# Patient Record
Sex: Female | Born: 1995 | Race: White | Hispanic: No | Marital: Single | Smoking: Never smoker
Health system: Southern US, Community
[De-identification: ages and names within clinical notes are randomized; demographics above are authoritative.]

## PROBLEM LIST (undated history)

## (undated) HISTORY — PX: TYMPANOPLASTY: SHX33

---

## 2017-06-27 ENCOUNTER — Ambulatory Visit
Admission: RE | Admit: 2017-06-27 | Discharge: 2017-06-27 | Disposition: A | Discharge status: Home / Self Care | Payer: BLUE CROSS/BLUE SHIELD | Source: Ambulatory Visit | Attending: Physician Assistant | Admitting: Physician Assistant

## 2017-06-27 ENCOUNTER — Other Ambulatory Visit: Payer: Self-pay | Admitting: Physician Assistant

## 2017-06-27 DIAGNOSIS — R05 Cough: Secondary | ICD-10-CM

## 2017-06-27 DIAGNOSIS — R059 Cough, unspecified: Secondary | ICD-10-CM

## 2018-03-21 IMAGING — DX DG CHEST 2V
2 series · 2 of 2 positions shown · non-contrast
Comparison: None.

CLINICAL DATA: Cough for 1 month

EXAM:
CHEST  2 VIEW

[dg chest 2 view (1 of 2)]
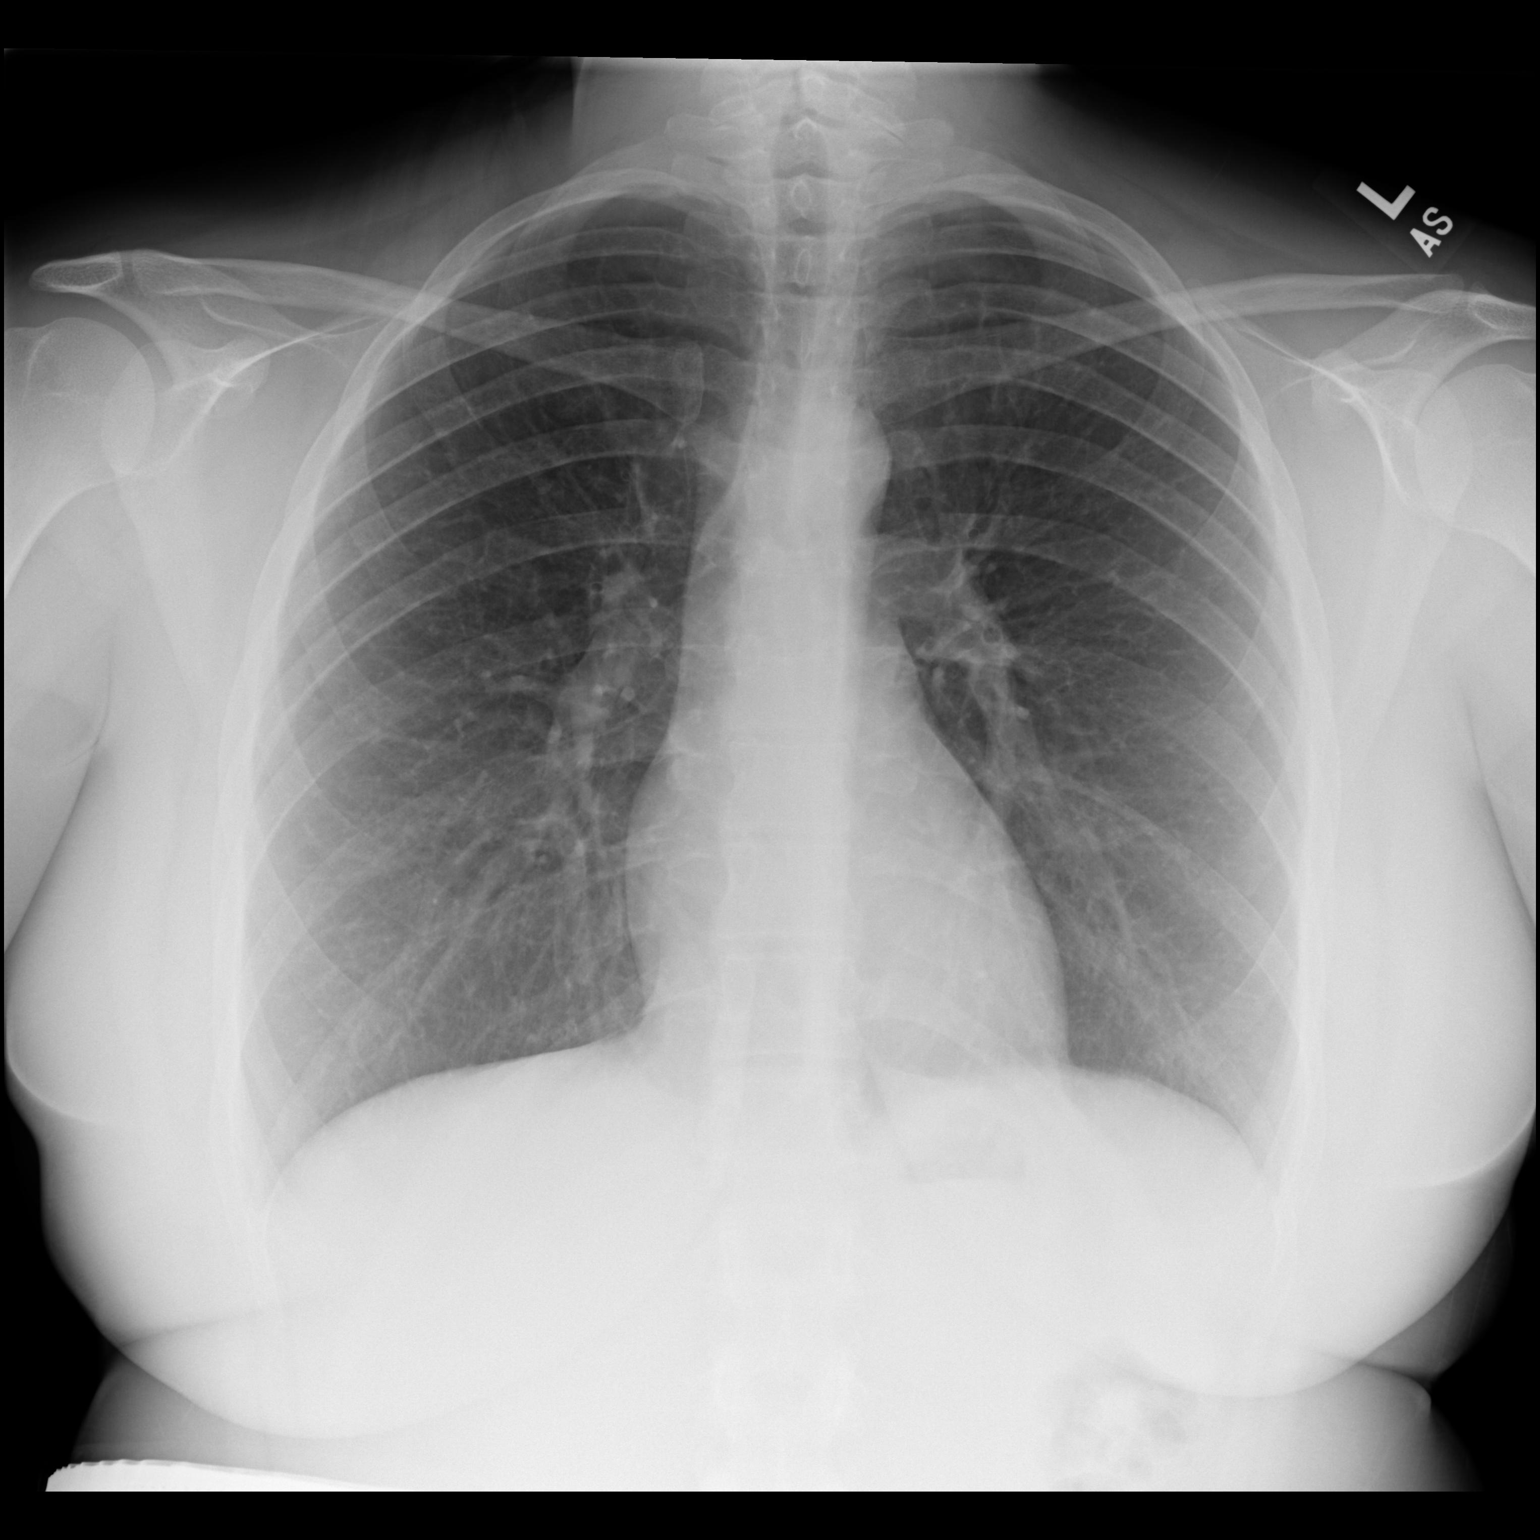

[dg chest 2 view (2 of 2)]
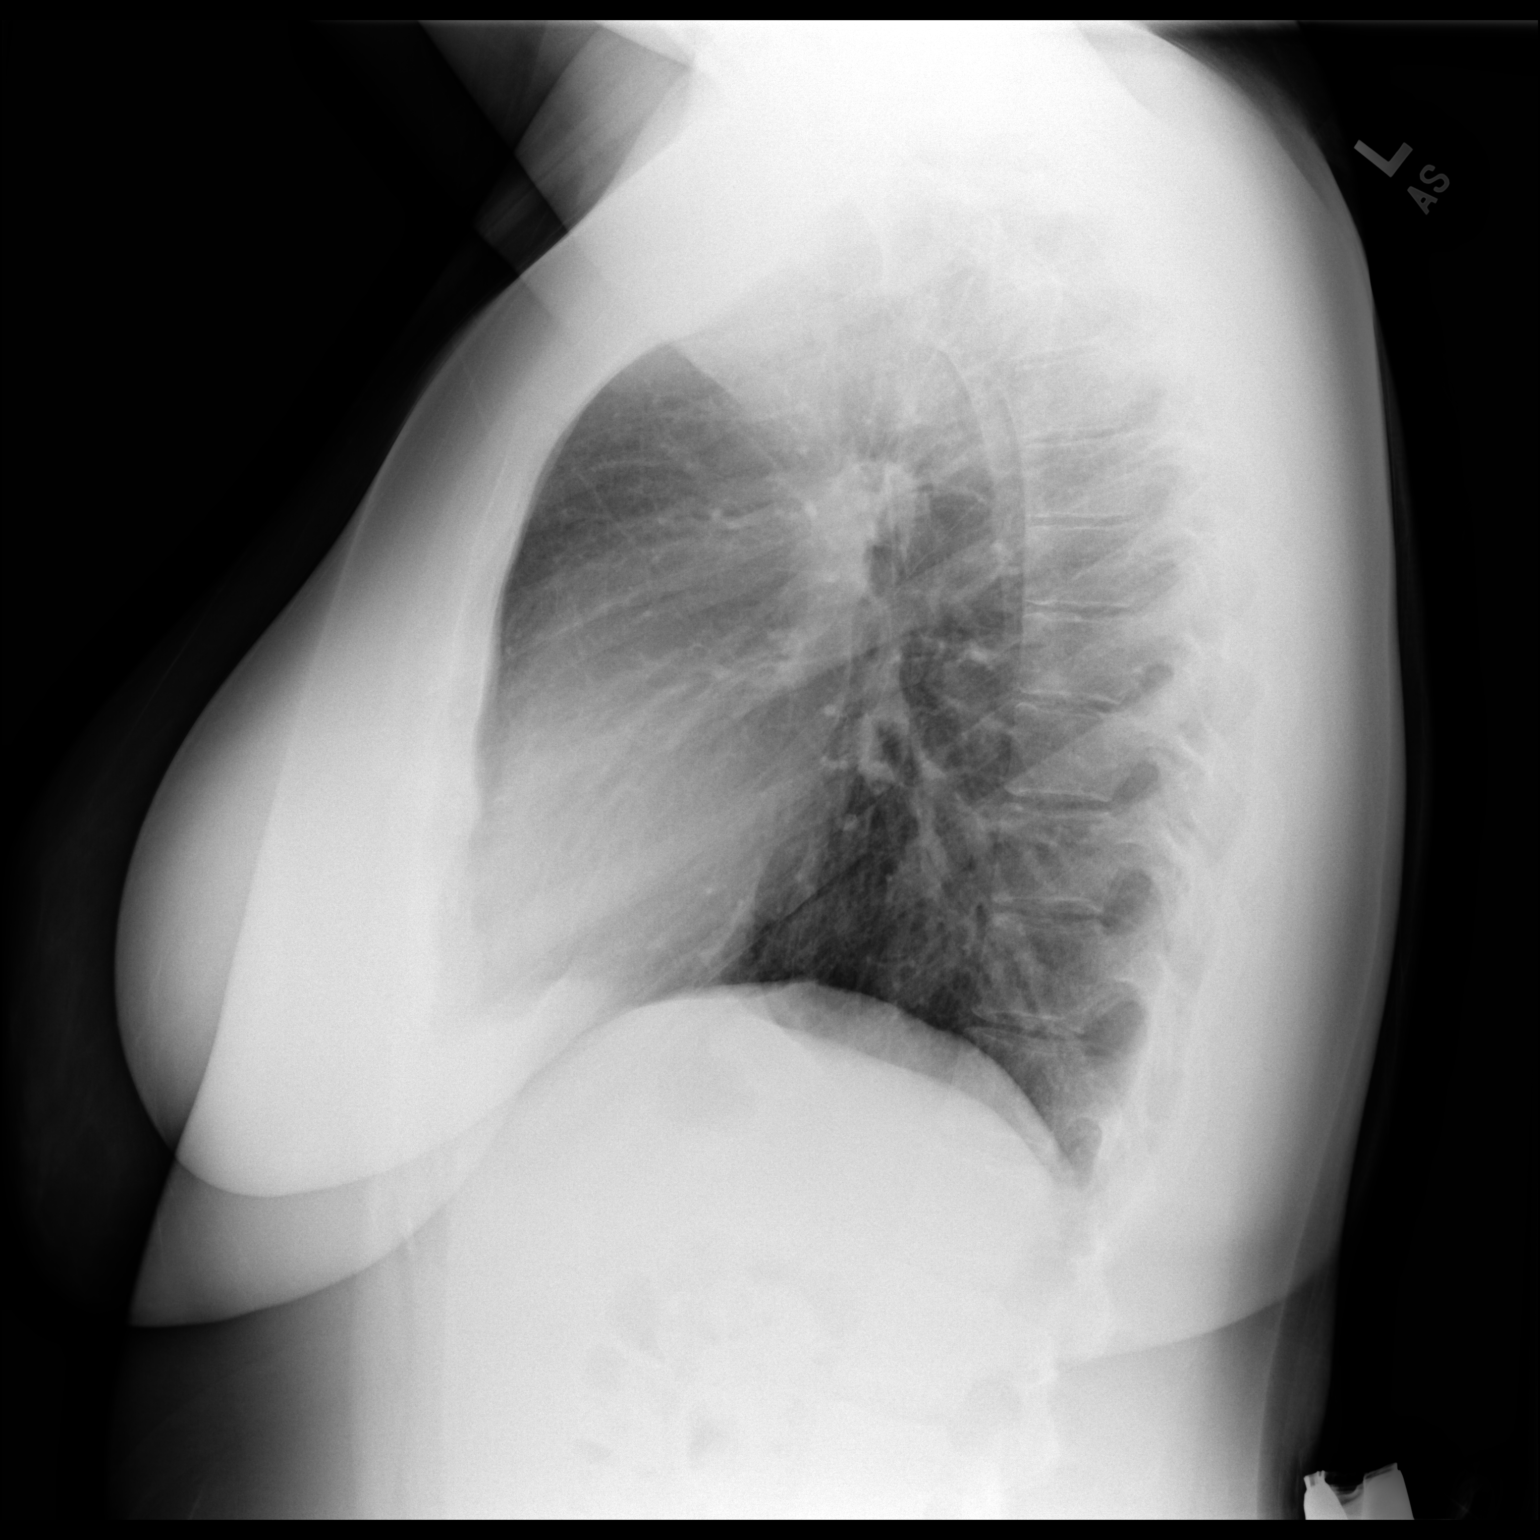

[2 of 2 positions shown; findings below may reference images not displayed]

FINDINGS: The heart size and mediastinal contours are within normal limits.
Both lungs are clear. The visualized skeletal structures are
unremarkable.
IMPRESSION: No active cardiopulmonary disease.

## 2019-06-18 ENCOUNTER — Other Ambulatory Visit: Payer: Self-pay

## 2019-06-18 DIAGNOSIS — Z20822 Contact with and (suspected) exposure to covid-19: Secondary | ICD-10-CM

## 2019-06-19 LAB — NOVEL CORONAVIRUS, NAA: SARS-CoV-2, NAA: NOT DETECTED

## 2019-08-14 ENCOUNTER — Other Ambulatory Visit: Payer: Self-pay

## 2019-08-14 ENCOUNTER — Encounter (HOSPITAL_BASED_OUTPATIENT_CLINIC_OR_DEPARTMENT_OTHER): Payer: Self-pay | Admitting: Emergency Medicine

## 2019-08-14 ENCOUNTER — Emergency Department (HOSPITAL_BASED_OUTPATIENT_CLINIC_OR_DEPARTMENT_OTHER)
Admission: EM | Admit: 2019-08-14 | Discharge: 2019-08-14 | Disposition: A | Discharge status: Home / Self Care | Payer: BLUE CROSS/BLUE SHIELD | Attending: Emergency Medicine | Admitting: Emergency Medicine

## 2019-08-14 ENCOUNTER — Emergency Department (HOSPITAL_BASED_OUTPATIENT_CLINIC_OR_DEPARTMENT_OTHER): Payer: BLUE CROSS/BLUE SHIELD

## 2019-08-14 DIAGNOSIS — M79605 Pain in left leg: Secondary | ICD-10-CM | POA: Diagnosis not present

## 2019-08-14 DIAGNOSIS — M79604 Pain in right leg: Secondary | ICD-10-CM | POA: Diagnosis present

## 2019-08-14 DIAGNOSIS — Z79899 Other long term (current) drug therapy: Secondary | ICD-10-CM | POA: Insufficient documentation

## 2019-08-14 LAB — D-DIMER, QUANTITATIVE: D-Dimer, Quant: 0.33 ug/mL-FEU (ref 0.00–0.50)

## 2019-08-14 NOTE — ED Triage Notes (Signed)
Pt c/o bilateral leg pain x 3 days. Pt reports new birth control x 16 days, denies injury, also denies sob, denies cough

## 2019-08-14 NOTE — Discharge Instructions (Signed)
Your [place] and blood indicate that you do NOT have a blood clot in your legs or lungs. You may use tylenol or motrin for pain. You may try wearing compression stockings. Your leg pain does not appear to be from a life, limb or organ threatening cause.  Please follow very closely with your primary care doctor.

## 2019-08-14 NOTE — ED Provider Notes (Signed)
[facility] EMERGENCY DEPARTMENT Provider Note   CSN: [id number] Arrival date & time: 08/14/19  1508     History Chief Complaint  Patient presents with  . Leg Pain    Bilateral    Jessica Riley is a 23 y.o. female who presents emergency department bilateral lower extremity pain.  Patient states that she started a new pack of oral contraceptive and had onset of bilateral lower extremity pain and heaviness of the calves about 30 minutes after taking her medication.  She states that when she was her symptoms had resolved.  Patient states that everything was going well up until Christmas Day when she had return of the symptoms which made her feel very anxious and feeling like intermittently short of breath which she attributes to anxiety.  She denies any hemoptysis.  She denies a history of DVT or PE personally or family history of DVT or PE.  She does not have any recent injuries, fevers, chills, weakness.  HPI     History reviewed. No pertinent past medical history.  There are no problems to display for this patient.   Past Surgical History:  Procedure Laterality Date  . TYMPANOPLASTY       OB History   No obstetric history on file.     History reviewed. No pertinent family history.  Social History   Tobacco Use  . Smoking status: Never Smoker  Substance Use Topics  . Alcohol use: Yes  . Drug use: Never    Home Medications Prior to Admission medications   Medication Sig Start Date End Date Taking? Authorizing Provider  buPROPion (WELLBUTRIN XL) 150 MG 24 hr tablet Take 150 mg by mouth daily.   Yes [provider]    Allergies    Patient has no allergy information on record.  Review of Systems   Review of Systems Ten systems reviewed and are negative for acute change, except as noted in the HPI.   Physical Exam Updated Vital Signs BP 110/71 (BP Location: Right Arm)   Pulse (!) 118   Temp 98.5 F (36.9 C) (Oral)   Resp 18   Wt 96.2 kg    LMP 07/28/2019 (Approximate)   SpO2 100%   Physical Exam Vitals and nursing note reviewed.  Constitutional:      General: She is not in acute distress.    Appearance: She is well-developed. She is not diaphoretic.  HENT:     Head: Normocephalic and atraumatic.  Eyes:     General: No scleral icterus.    Conjunctiva/sclera: Conjunctivae normal.  Cardiovascular:     Rate and Rhythm: Normal rate and regular rhythm.     Heart sounds: Normal heart sounds. No murmur. No friction rub. No gallop.   Pulmonary:     Effort: Pulmonary effort is normal. No respiratory distress.     Breath sounds: Normal breath sounds.  Abdominal:     General: Bowel sounds are normal. There is no distension.     Palpations: Abdomen is soft. There is no mass.     Tenderness: There is no abdominal tenderness. There is no guarding.  Musculoskeletal:     Cervical back: Normal range of motion.     Right lower leg: No edema.     Left lower leg: No edema.  Skin:    General: Skin is warm and dry.  Neurological:     Mental Status: She is alert and oriented to person, place, and time.  Psychiatric:  Behavior: Behavior normal.     ED Results / Procedures / Treatments   Labs (all labs ordered are listed, but only abnormal results are displayed) Labs Reviewed - No data to display  EKG None  Radiology No results found.  Procedures Procedures (including critical care time)  Medications Ordered in ED Medications - No data to display  ED Course  I have reviewed the triage vital signs and the nursing notes.  Pertinent labs & imaging results that were available during my care of the patient were reviewed by me and considered in my medical decision making (see chart for details).    MDM Rules/Calculators/A&P                      Here with complaint of bilateral leg pain.  No joint pain or swelling.  No fevers or chills suggestive of autoimmune process.  It has negative bilateral DVT studies on my  interpretation.  Patient did have initial tachycardia however she has a D-dimer within normal limits and has no symptoms of pulmonary embolus to include chest pain, pleuritic pain or shortness of breath.  I discussed case with Dr. Clarene Duke.  Patient will be discharged to follow-up with primary care physician.  Discussed return precautions.  She was appropriate for discharge at this time Final Clinical Impression(s) / ED Diagnoses Final diagnoses:  None    Rx / DC Orders ED Discharge Orders    None       Arthor Captain, PA-C 08/14/19 2355    Little, Ambrose [place], MD 08/15/19 1524

## 2020-05-07 IMAGING — US US EXTREM LOW VENOUS
1 series · 13 of 24 positions shown · non-contrast
Comparison: None

CLINICAL DATA: BILATERAL knee pain and heaviness 2 days after
starting in taking new birth control pills



[Series 1: us extrem low venous · 13 of 58 slices shown]
[im 1/58]
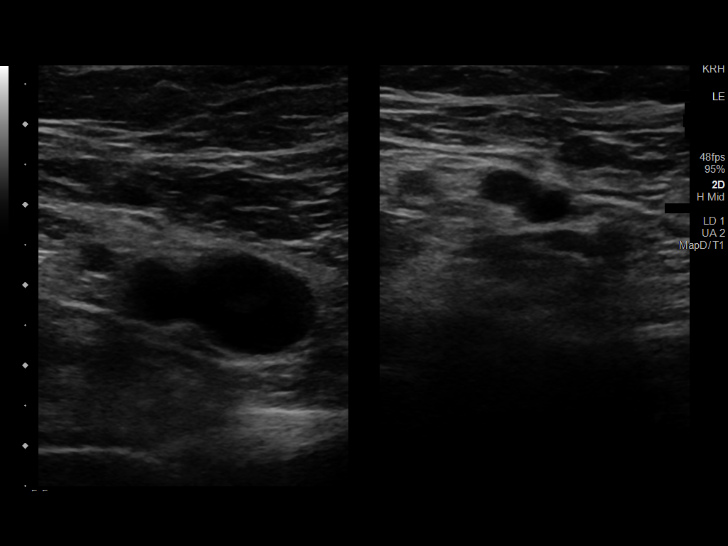
[im 5/58]
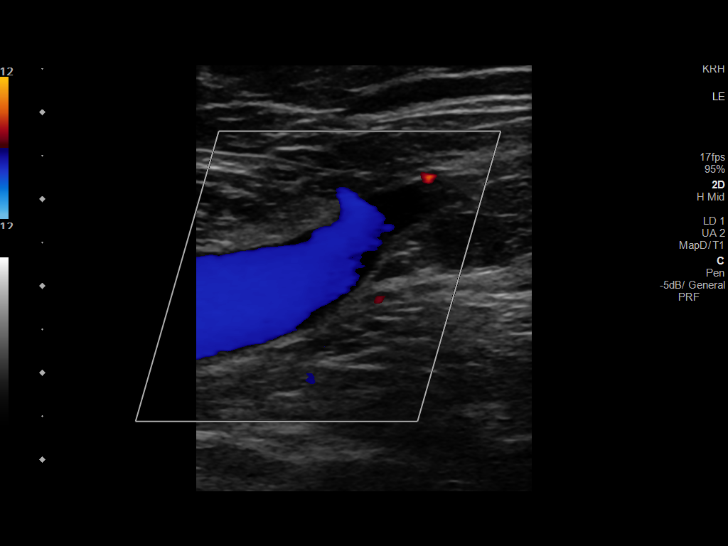
[im 10/58]
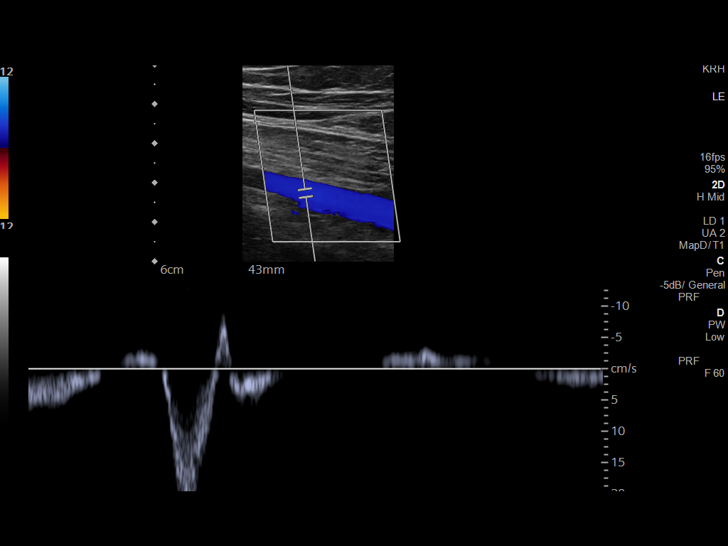
[im 15/58]
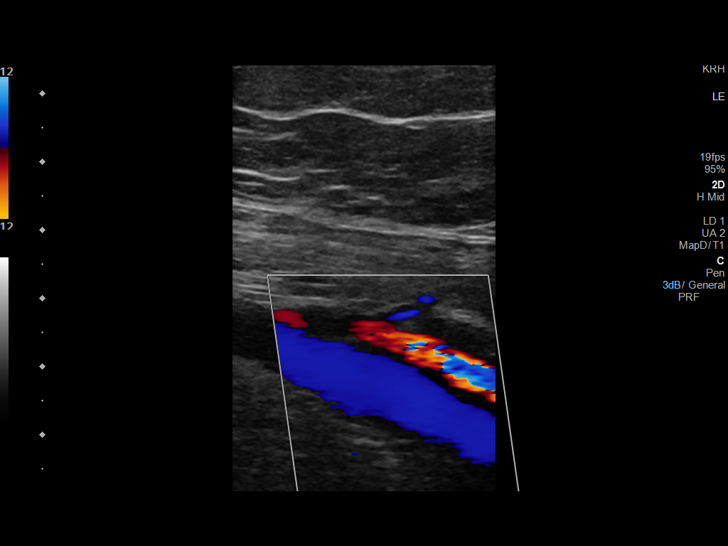
[im 20/58]
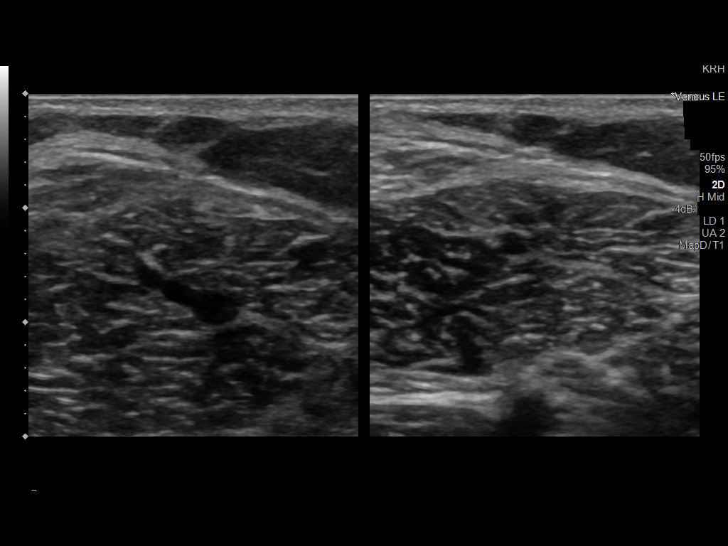
[im 25/58]
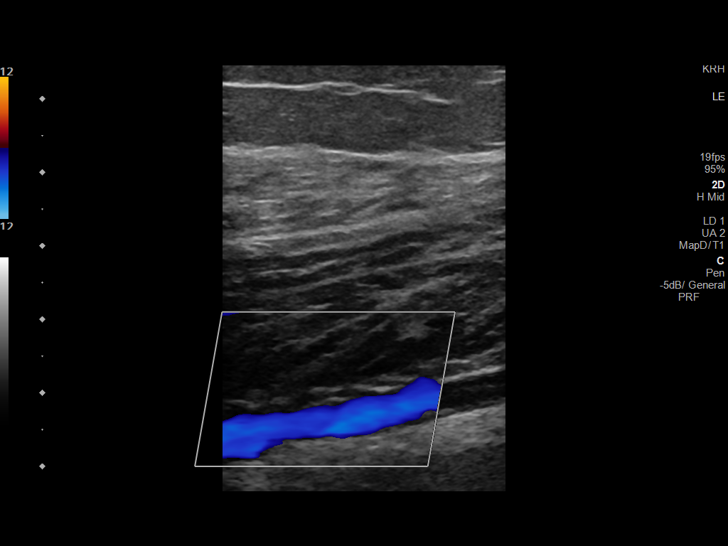
[im 30/58]
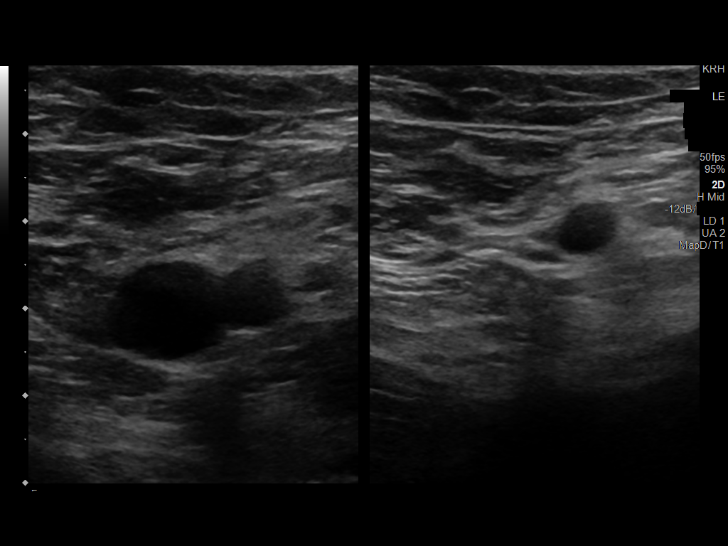
[im 33/58]
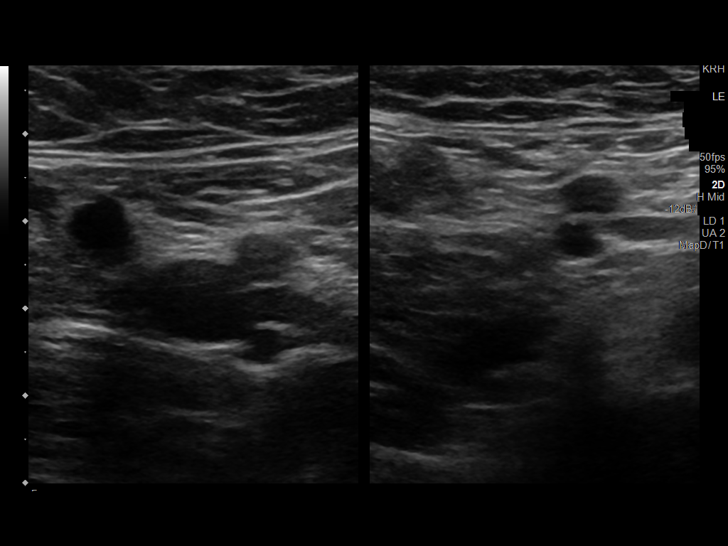
[im 38/58]
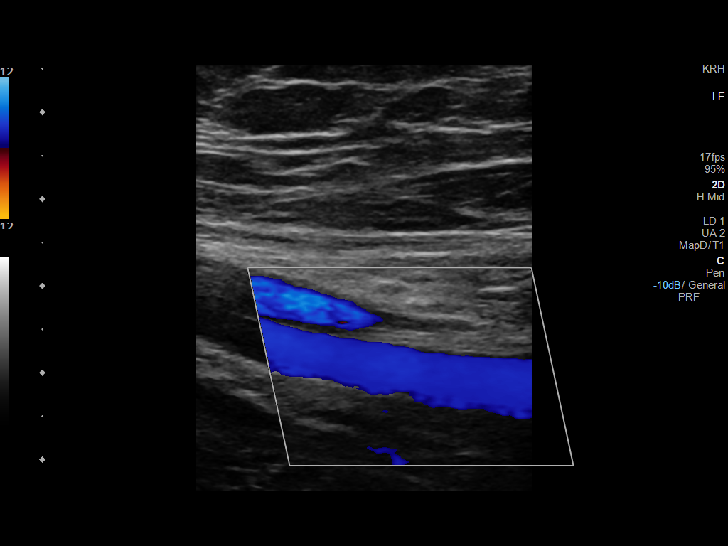
[im 43/58]
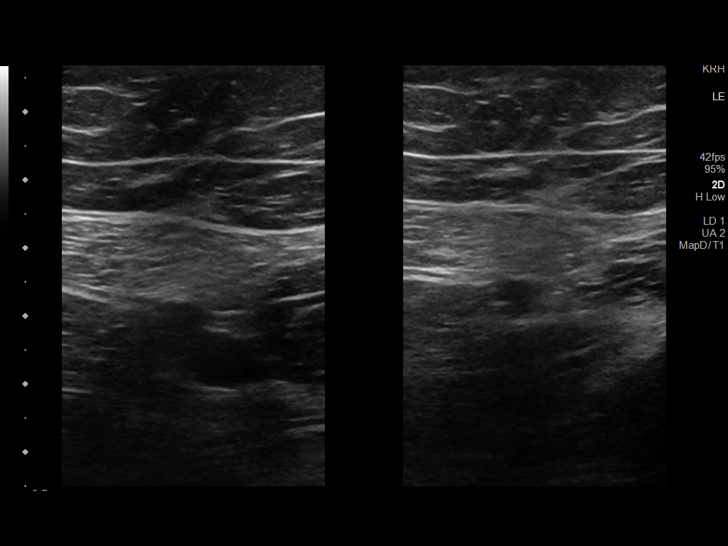
[im 48/58]
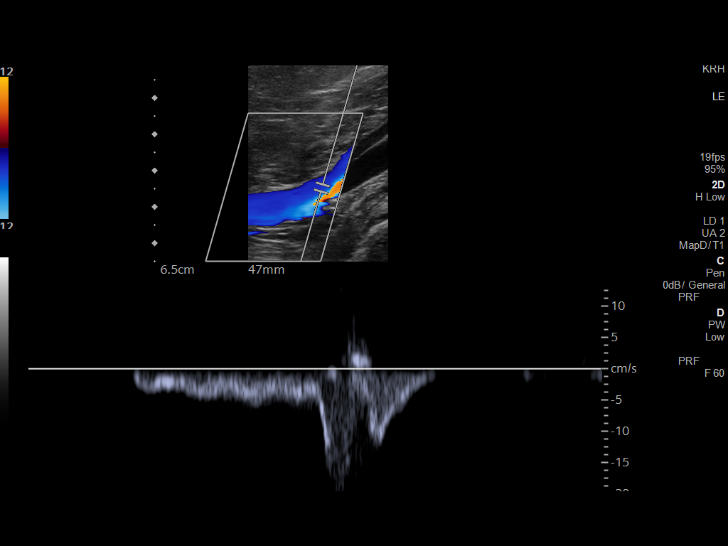
[im 53/58]
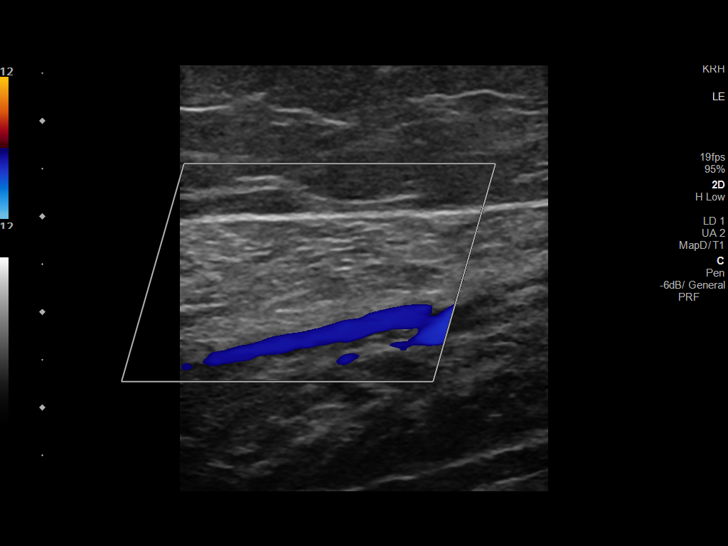
[im 58/58]
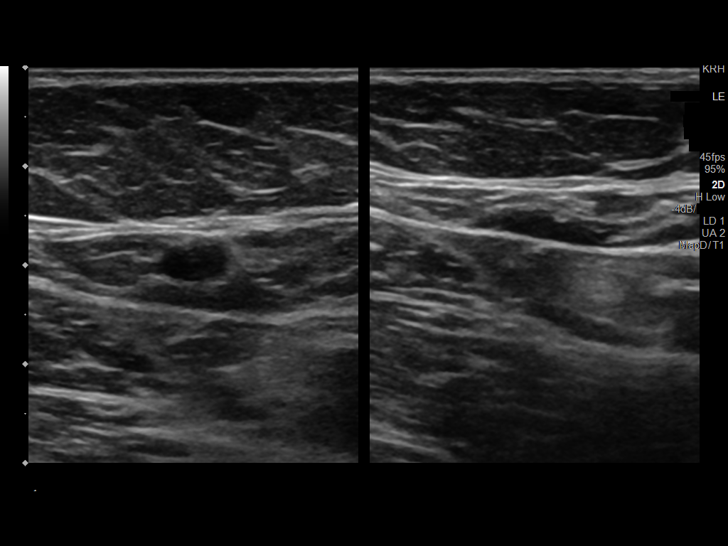

[13 of 24 positions shown; findings below may reference images not displayed]

FINDINGS: RIGHT LOWER EXTREMITY

Common Femoral Vein: No evidence of thrombus. Normal
compressibility, respiratory phasicity and response to augmentation.

Saphenofemoral Junction: No evidence of thrombus. Normal
compressibility and flow on color Doppler imaging.

Profunda Femoral Vein: No evidence of thrombus. Normal
compressibility and flow on color Doppler imaging.

Femoral Vein: No evidence of thrombus. Normal compressibility,
respiratory phasicity and response to augmentation.

Popliteal Vein: No evidence of thrombus. Normal compressibility,
respiratory phasicity and response to augmentation.

Calf Veins: No evidence of thrombus. Normal compressibility and flow
on color Doppler imaging.

Superficial Great Saphenous Vein: No evidence of thrombus. Normal
compressibility.

Venous Reflux:  None.

Other Findings:  None.

LEFT LOWER EXTREMITY

Common Femoral Vein: No evidence of thrombus. Normal
compressibility, respiratory phasicity and response to augmentation.

Saphenofemoral Junction: No evidence of thrombus. Normal
compressibility and flow on color Doppler imaging.

Profunda Femoral Vein: No evidence of thrombus. Normal
compressibility and flow on color Doppler imaging.

Femoral Vein: No evidence of thrombus. Normal compressibility,
respiratory phasicity and response to augmentation.

Popliteal Vein: No evidence of thrombus. Normal compressibility,
respiratory phasicity and response to augmentation.

Calf Veins: No evidence of thrombus. Normal compressibility and flow
on color Doppler imaging.

Superficial Great Saphenous Vein: No evidence of thrombus. Normal
compressibility.

Venous Reflux:  None.

Other Findings:  None.
IMPRESSION: No evidence of deep venous thrombosis in either lower extremity.
